# Patient Record
Sex: Female | Born: 1975 | Race: White | Hispanic: No | Marital: Married
Health system: Southern US, Community
[De-identification: ages and names within clinical notes are randomized; demographics above are authoritative.]

---

## 2018-11-22 ENCOUNTER — Other Ambulatory Visit: Payer: Self-pay | Admitting: Obstetrics & Gynecology

## 2018-11-22 DIAGNOSIS — Z1231 Encounter for screening mammogram for malignant neoplasm of breast: Secondary | ICD-10-CM

## 2018-11-23 ENCOUNTER — Ambulatory Visit: Payer: Self-pay

## 2018-11-24 ENCOUNTER — Ambulatory Visit
Admission: RE | Admit: 2018-11-24 | Discharge: 2018-11-24 | Disposition: A | Discharge status: Home / Self Care | Payer: BLUE CROSS/BLUE SHIELD | Source: Ambulatory Visit | Attending: Obstetrics & Gynecology | Admitting: Obstetrics & Gynecology

## 2018-11-24 DIAGNOSIS — Z1231 Encounter for screening mammogram for malignant neoplasm of breast: Secondary | ICD-10-CM

## 2020-01-21 ENCOUNTER — Ambulatory Visit: Payer: BLUE CROSS/BLUE SHIELD | Attending: Internal Medicine

## 2020-01-21 DIAGNOSIS — Z23 Encounter for immunization: Secondary | ICD-10-CM | POA: Insufficient documentation

## 2020-01-21 NOTE — Progress Notes (Signed)
   Covid-19 Vaccination Clinic  Name:  Jasmine Mendoza    MRN: [id number] DOB: 09/09/1976  01/21/2020  Jasmine Mendoza was observed post Covid-19 immunization for 15 minutes without incident. She was provided with Vaccine Information Sheet and instruction to access the V-Safe system.   Jasmine Mendoza was instructed to call 911 with any severe reactions post vaccine: Marland Kitchen Difficulty breathing  . Swelling of face and throat  . A fast heartbeat  . A bad rash all over body  . Dizziness and weakness   Immunizations Administered    Name Date Dose VIS Date Route   Pfizer COVID-19 Vaccine 01/21/2020  5:41 PM 0.3 mL 10/27/2019 Intramuscular   Manufacturer: [employer], [employer]   Lot: [id number]   NDC: [address]-6

## 2020-02-11 ENCOUNTER — Ambulatory Visit: Payer: Self-pay | Attending: Internal Medicine

## 2020-02-11 DIAGNOSIS — Z23 Encounter for immunization: Secondary | ICD-10-CM

## 2020-02-11 NOTE — Progress Notes (Signed)
   Covid-19 Vaccination Clinic  Name:  Jasmine Mendoza    MRN: [id number] DOB: 1976-11-15  02/11/2020  [name] was observed post Covid-19 immunization for 15 minutes without incident. She was provided with Vaccine Information Sheet and instruction to access the V-Safe system.   [name] was instructed to call 911 with any severe reactions post vaccine: Marland Kitchen Difficulty breathing  . Swelling of face and throat  . A fast heartbeat  . A bad rash all over body  . Dizziness and weakness   Immunizations Administered    Name Date Dose VIS Date Route   Pfizer COVID-19 Vaccine 02/11/2020  3:42 PM 0.3 mL 10/27/2019 Intramuscular   Manufacturer: [employer], [employer]   Lot: [id number]   NDC: [address]-4

## 2021-01-02 ENCOUNTER — Other Ambulatory Visit: Payer: Self-pay | Admitting: Obstetrics and Gynecology

## 2021-01-02 DIAGNOSIS — Z1231 Encounter for screening mammogram for malignant neoplasm of breast: Secondary | ICD-10-CM

## 2021-01-09 ENCOUNTER — Ambulatory Visit: Payer: Self-pay

## 2021-02-25 ENCOUNTER — Other Ambulatory Visit: Payer: Self-pay

## 2021-02-25 ENCOUNTER — Ambulatory Visit
Admission: RE | Admit: 2021-02-25 | Discharge: 2021-02-25 | Disposition: A | Discharge status: Home / Self Care | Payer: BC Managed Care – PPO | Source: Ambulatory Visit | Attending: Obstetrics and Gynecology | Admitting: Obstetrics and Gynecology

## 2021-02-25 DIAGNOSIS — Z1231 Encounter for screening mammogram for malignant neoplasm of breast: Secondary | ICD-10-CM

## 2021-11-25 IMAGING — MG MM DIGITAL SCREENING BILAT W/ TOMO AND CAD
6 of 10 series · 6 of 30 positions shown · non-contrast
Comparison: Previous exam(s).

CLINICAL DATA: Screening.

EXAM:
DIGITAL SCREENING BILATERAL MAMMOGRAM WITH TOMOSYNTHESIS AND CAD
TECHNIQUE: Bilateral screening digital craniocaudal and mediolateral oblique
mammograms were obtained. Bilateral screening digital breast
tomosynthesis was performed. The images were evaluated with
computer-aided detection.

[R MLO synth-2D]
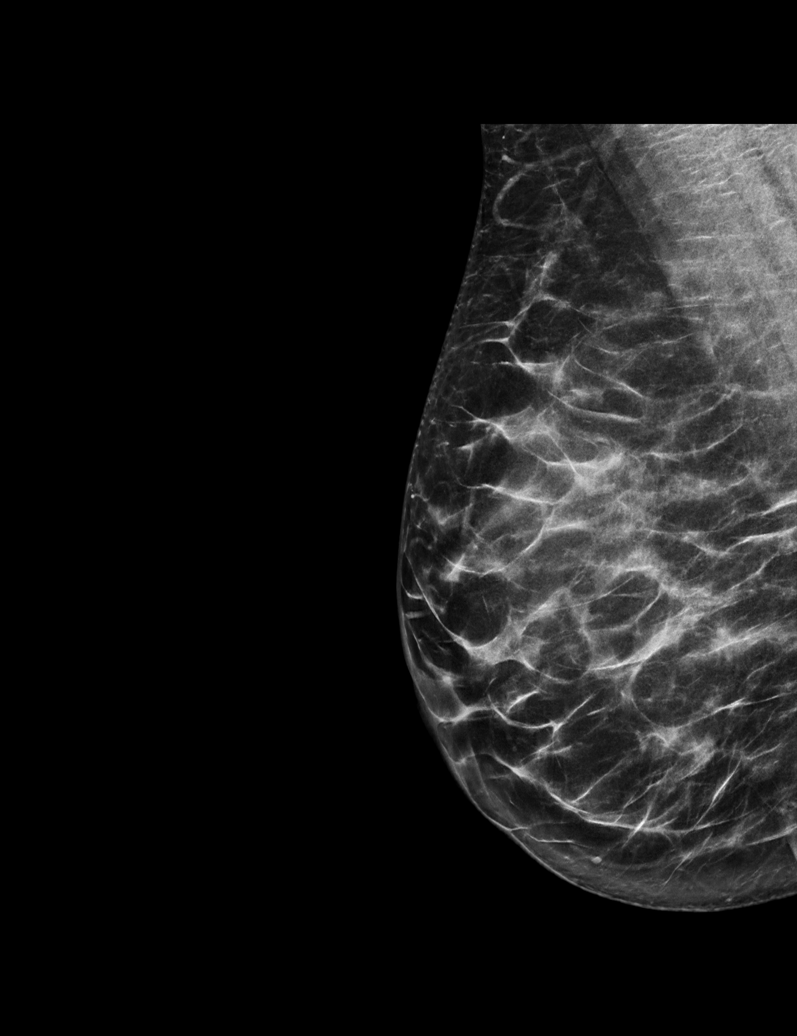

[L XCCL synth-2D]
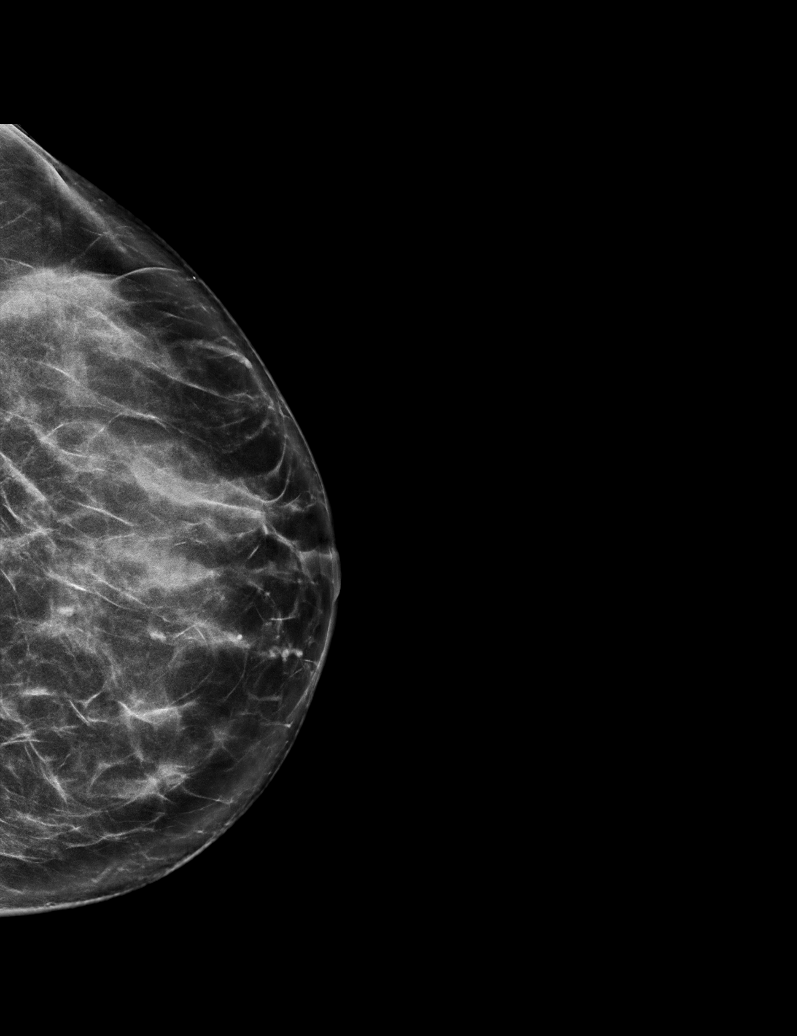

[R CC synth-2D]
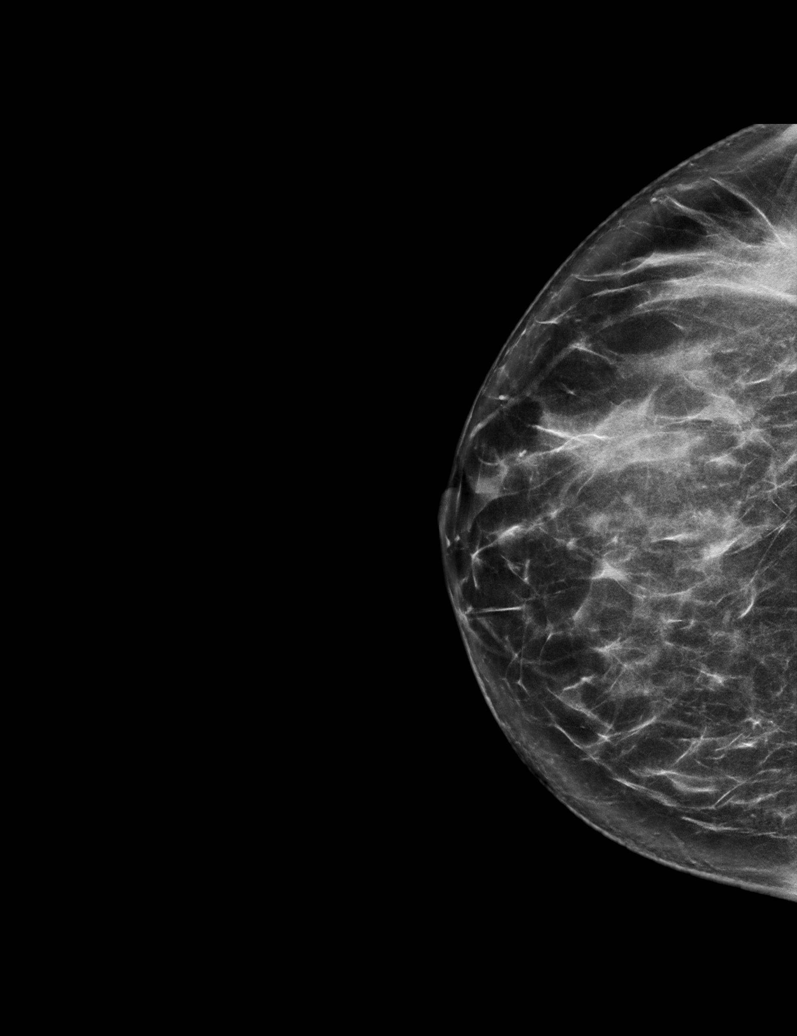

[L MLO synth-2D]
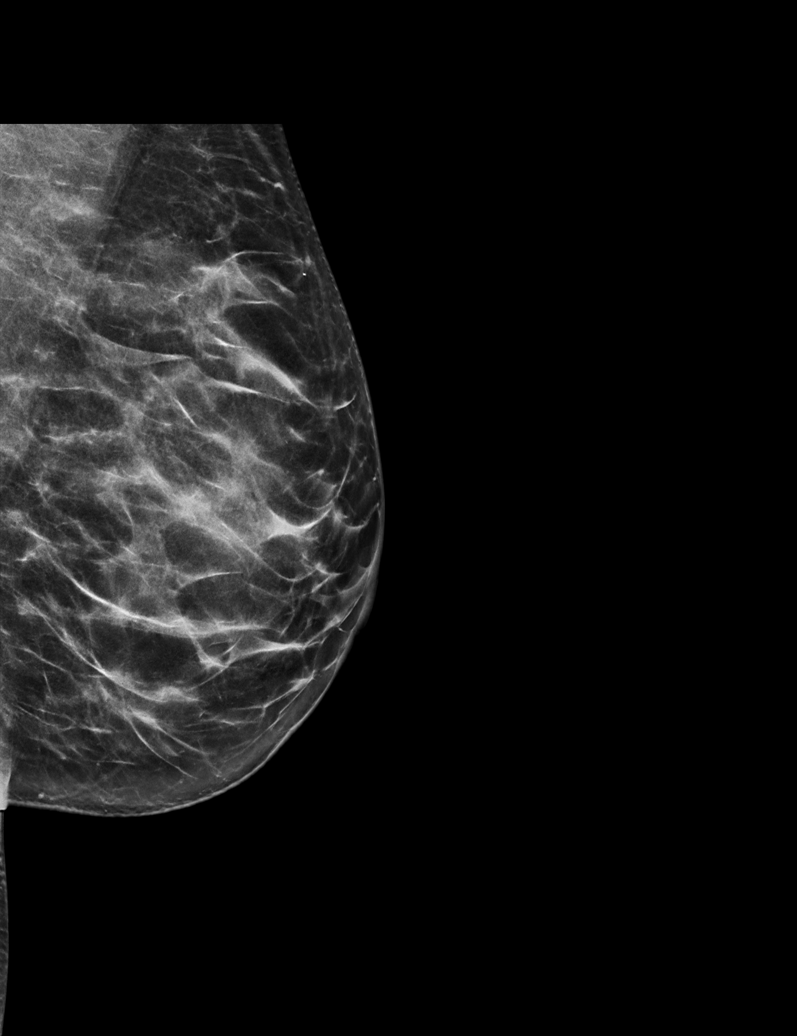

[L CC synth-2D]
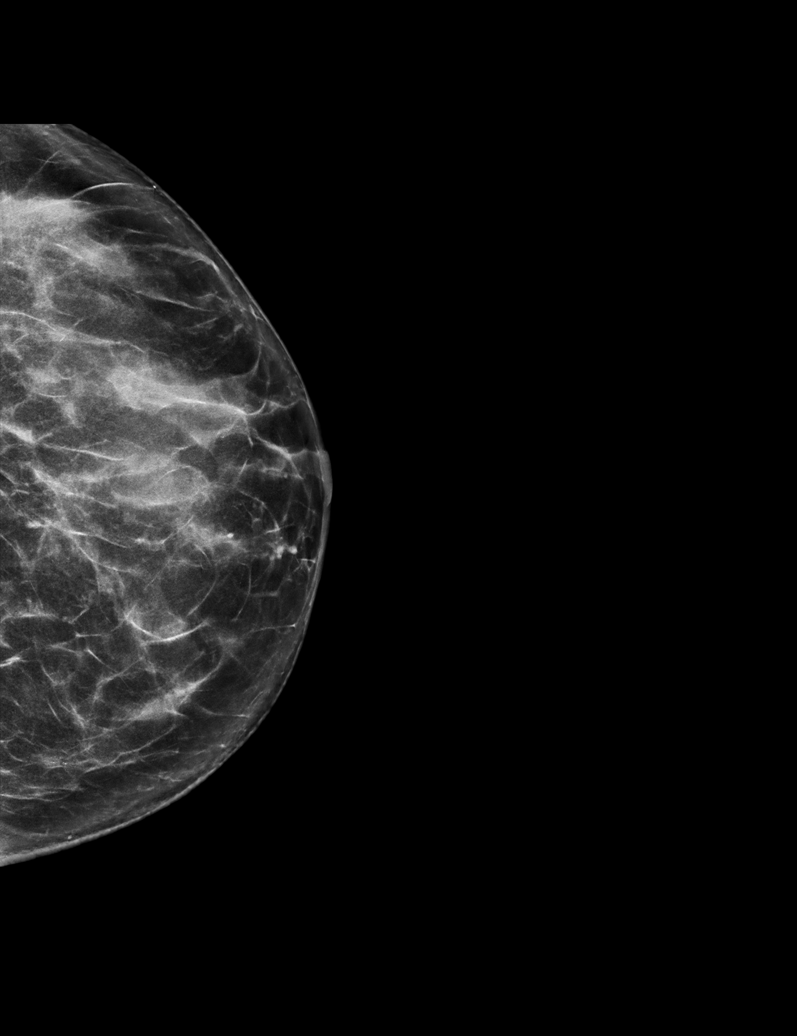

[L MLO tomo · tomo slice 32/63.0]
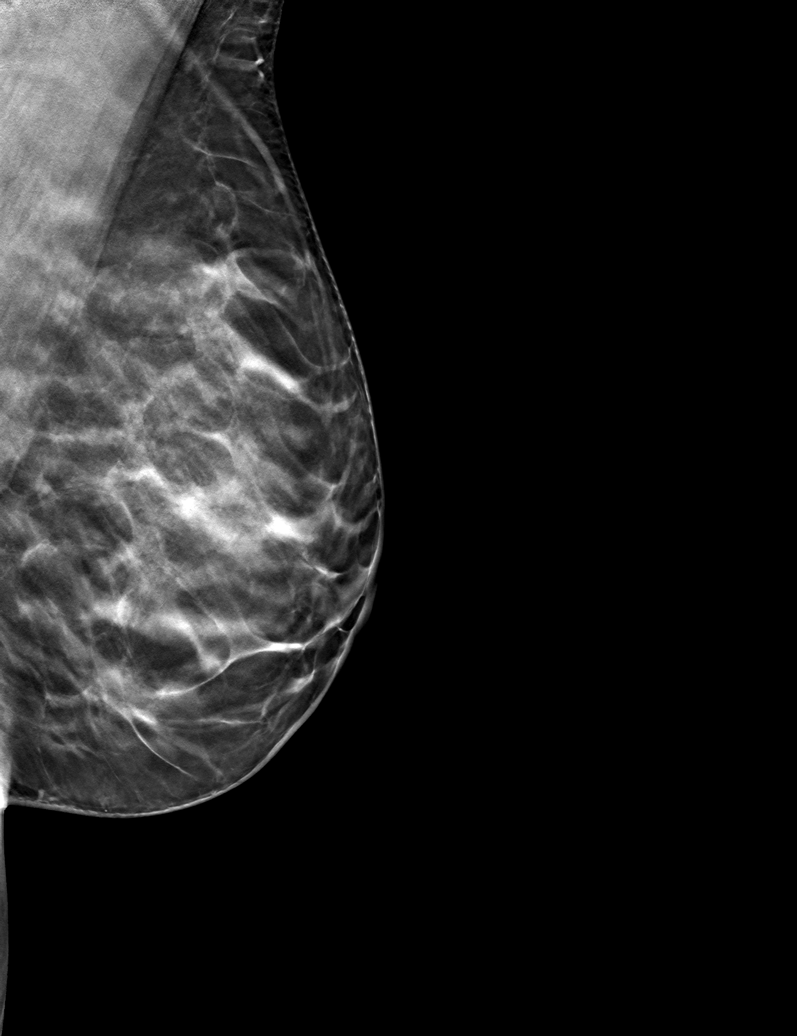

[6 of 30 positions shown; findings below may reference images not displayed]

ACR Breast Density Category c: The breast tissue is heterogeneously
dense, which may obscure small masses.
FINDINGS: There are no findings suspicious for malignancy. The images were
evaluated with computer-aided detection.
IMPRESSION: No mammographic evidence of malignancy. A result letter of this
screening mammogram will be mailed directly to the patient.

RECOMMENDATION:
Screening mammogram in one year. (Code:T4-5-GWO)

BI-RADS CATEGORY  1: Negative.

## 2022-06-10 ENCOUNTER — Other Ambulatory Visit: Payer: Self-pay | Admitting: Obstetrics and Gynecology

## 2022-06-10 DIAGNOSIS — Z1231 Encounter for screening mammogram for malignant neoplasm of breast: Secondary | ICD-10-CM

## 2022-06-18 ENCOUNTER — Ambulatory Visit
Admission: RE | Admit: 2022-06-18 | Discharge: 2022-06-18 | Disposition: A | Discharge status: Home / Self Care | Payer: BC Managed Care – PPO | Source: Ambulatory Visit | Attending: Obstetrics and Gynecology | Admitting: Obstetrics and Gynecology

## 2022-06-18 DIAGNOSIS — Z1231 Encounter for screening mammogram for malignant neoplasm of breast: Secondary | ICD-10-CM

## 2024-07-18 ENCOUNTER — Other Ambulatory Visit: Payer: Self-pay | Admitting: Obstetrics and Gynecology

## 2024-07-18 DIAGNOSIS — Z1231 Encounter for screening mammogram for malignant neoplasm of breast: Secondary | ICD-10-CM

## 2024-07-19 ENCOUNTER — Inpatient Hospital Stay
Admission: RE | Admit: 2024-07-19 | Discharge: 2024-07-19 | Discharge status: Home / Self Care | Source: Ambulatory Visit | Attending: Obstetrics and Gynecology | Admitting: Obstetrics and Gynecology

## 2024-07-19 DIAGNOSIS — Z1231 Encounter for screening mammogram for malignant neoplasm of breast: Secondary | ICD-10-CM
